# Patient Record
Sex: Male | Born: 1985 | Race: Black or African American | Hispanic: No | Marital: Single | Smoking: Current every day smoker
Health system: Southern US, Community
[De-identification: ages and names within clinical notes are randomized; demographics above are authoritative.]

## PROBLEM LIST (undated history)

## (undated) DIAGNOSIS — Z8659 Personal history of other mental and behavioral disorders: Secondary | ICD-10-CM

## (undated) DIAGNOSIS — Z720 Tobacco use: Secondary | ICD-10-CM

## (undated) HISTORY — DX: Personal history of other mental and behavioral disorders: Z86.59

## (undated) HISTORY — PX: HERNIA REPAIR: SHX51

## (undated) HISTORY — PX: TONSILLECTOMY: SUR1361

## (undated) HISTORY — DX: Tobacco use: Z72.0

## (undated) HISTORY — PX: ADENOIDECTOMY: SUR15

---
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
## 2007-08-17 ENCOUNTER — Emergency Department (HOSPITAL_COMMUNITY): Admission: EM | Admit: 2007-08-17 | Discharge: 2007-08-18 | Payer: Self-pay | Admitting: Emergency Medicine

## 2012-02-09 ENCOUNTER — Emergency Department (HOSPITAL_BASED_OUTPATIENT_CLINIC_OR_DEPARTMENT_OTHER)
Admission: EM | Admit: 2012-02-09 | Discharge: 2012-02-09 | Disposition: A | Discharge status: Home / Self Care | Payer: Self-pay | Attending: Emergency Medicine | Admitting: Emergency Medicine

## 2012-02-09 ENCOUNTER — Encounter (HOSPITAL_BASED_OUTPATIENT_CLINIC_OR_DEPARTMENT_OTHER): Payer: Self-pay | Admitting: Emergency Medicine

## 2012-02-09 ENCOUNTER — Emergency Department (INDEPENDENT_AMBULATORY_CARE_PROVIDER_SITE_OTHER): Payer: Self-pay

## 2012-02-09 DIAGNOSIS — S139XXA Sprain of joints and ligaments of unspecified parts of neck, initial encounter: Secondary | ICD-10-CM | POA: Insufficient documentation

## 2012-02-09 DIAGNOSIS — S335XXA Sprain of ligaments of lumbar spine, initial encounter: Secondary | ICD-10-CM | POA: Insufficient documentation

## 2012-02-09 DIAGNOSIS — M542 Cervicalgia: Secondary | ICD-10-CM | POA: Insufficient documentation

## 2012-02-09 DIAGNOSIS — M549 Dorsalgia, unspecified: Secondary | ICD-10-CM

## 2012-02-09 DIAGNOSIS — Y92410 Unspecified street and highway as the place of occurrence of the external cause: Secondary | ICD-10-CM | POA: Insufficient documentation

## 2012-02-09 DIAGNOSIS — S239XXA Sprain of unspecified parts of thorax, initial encounter: Secondary | ICD-10-CM | POA: Insufficient documentation

## 2012-02-09 DIAGNOSIS — S161XXA Strain of muscle, fascia and tendon at neck level, initial encounter: Secondary | ICD-10-CM

## 2012-02-09 DIAGNOSIS — S39012A Strain of muscle, fascia and tendon of lower back, initial encounter: Secondary | ICD-10-CM

## 2012-02-09 DIAGNOSIS — IMO0002 Reserved for concepts with insufficient information to code with codable children: Secondary | ICD-10-CM

## 2012-02-09 MED ORDER — IBUPROFEN 800 MG PO TABS
800.0000 mg | ORAL_TABLET | Freq: Once | ORAL | Status: AC
  Administered 2012-02-09: 800 mg via ORAL
  Filled 2012-02-09: qty 1

## 2012-02-09 MED ORDER — DIAZEPAM 5 MG PO TABS: 5.0000 mg | ORAL_TABLET | Freq: Three times a day (TID) | ORAL | Status: AC | PRN

## 2012-02-09 MED ORDER — HYDROCODONE-ACETAMINOPHEN 5-325 MG PO TABS: 2.0000 | ORAL_TABLET | ORAL | Status: AC | PRN

## 2012-02-09 MED ORDER — IBUPROFEN 800 MG PO TABS: 800.0000 mg | ORAL_TABLET | Freq: Three times a day (TID) | ORAL | Status: AC | PRN
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
## 2012-10-01 ENCOUNTER — Emergency Department (INDEPENDENT_AMBULATORY_CARE_PROVIDER_SITE_OTHER)
Admission: EM | Admit: 2012-10-01 | Discharge: 2012-10-01 | Disposition: A | Discharge status: Home / Self Care | Payer: Self-pay | Source: Home / Self Care | Attending: Emergency Medicine | Admitting: Emergency Medicine

## 2012-10-01 ENCOUNTER — Encounter (HOSPITAL_COMMUNITY): Payer: Self-pay

## 2012-10-01 DIAGNOSIS — L255 Unspecified contact dermatitis due to plants, except food: Secondary | ICD-10-CM

## 2012-10-01 DIAGNOSIS — R21 Rash and other nonspecific skin eruption: Secondary | ICD-10-CM

## 2012-10-01 MED ORDER — TRIAMCINOLONE ACETONIDE 0.1 % EX CREA: TOPICAL_CREAM | Freq: Two times a day (BID) | CUTANEOUS | Status: DC

## 2012-10-01 MED ORDER — TRIAMCINOLONE ACETONIDE 40 MG/ML IJ SUSP
60.0000 mg | Freq: Once | INTRAMUSCULAR | Status: AC
  Administered 2012-10-01: 60 mg via INTRAMUSCULAR

## 2012-10-01 MED ORDER — TRIAMCINOLONE ACETONIDE 40 MG/ML IJ SUSP
INTRAMUSCULAR | Status: AC
  Filled 2012-10-01: qty 5

## 2012-10-01 NOTE — ED Provider Notes (Signed)
Medical screening examination/treatment/procedure(s) were performed by non-physician practitioner and as supervising physician I was immediately available for consultation/collaboration.  Leslee Home, M.D.   Reuben Likes, MD 10/01/12 [phone]

## 2012-10-01 NOTE — ED Notes (Signed)
C/o rash started 09/23/2012 states that started after doing yard work

## 2012-10-01 NOTE — ED Provider Notes (Signed)
History     CSN: [id number]  Arrival date & time 10/01/12  1708   None     Chief Complaint  Patient presents with  . Rash    (Consider location/radiation/quality/duration/timing/severity/associated sxs/prior treatment) Patient is a 26 y.o. male presenting with rash. The history is provided by the patient.  Rash   This patient complains of a pruritic rash.  Location: bilateral forearms, abdomen and left leg  Onset: 1 wk ago   Course: unchanged Self-treated with: hydrocortisone            Improvement with treatment: no  History Itching: yes  Tenderness: no  New medications/antibiotics: no  Pet exposure: no  Recent travel or tropical exposure: yard work one week ago New soaps, shampoos, detergent, clothing: no Tick/insect exposure: no   Red Flags Feeling ill: no Fever:no Facial/tongue swelling/difficulty breathing:  no  Diabetic or immunocompromised: no   History reviewed. No pertinent past medical history.  History reviewed. No pertinent past surgical history.  No family history on file.  History  Substance Use Topics  . Smoking status: Current Every Day Smoker    Types: Cigarettes  . Smokeless tobacco: Not on file  . Alcohol Use: No      Review of Systems  Constitutional: Negative.   HENT: Negative.   Respiratory: Negative.   Cardiovascular: Negative.   Skin: Positive for rash. Negative for color change, pallor and wound.    Allergies  Review of patient's allergies indicates no known allergies.  Home Medications   Current Outpatient Rx  Name Route Sig Dispense Refill  . TRIAMCINOLONE ACETONIDE 0.1 % EX CREA Topical Apply topically 2 (two) times daily. 30 g 2    BP 124/67  Pulse 71  Temp 98.1 F (36.7 C) (Oral)  Resp 17  SpO2 100%  Physical Exam  Nursing note and vitals reviewed. Constitutional: He is oriented to person, place, and time. Vital signs are normal. He appears well-developed and well-nourished. He is active and  cooperative.  HENT:  Head: Normocephalic.  Eyes: Conjunctivae normal are normal. Pupils are equal, round, and reactive to light. No scleral icterus.  Neck: Trachea normal. Neck supple.  Cardiovascular: Normal rate and regular rhythm.   Pulmonary/Chest: Effort normal and breath sounds normal.  Neurological: He is alert and oriented to person, place, and time. No cranial nerve deficit or sensory deficit.  Skin: Skin is warm and dry. Rash noted. Rash is pustular.     Psychiatric: He has a normal mood and affect. His speech is normal and behavior is normal. Judgment and thought content normal. Cognition and memory are normal.    ED Course  Procedures (including critical care time)  Labs Reviewed - No data to display No results found.   1. Contact dermatitis due to plant   2. Rash and nonspecific skin eruption       MDM  Kenalog 60mg  IM administered in office.  Cool showers; avoid heat, sunlight and anything that makes condition worse. Zyrtec for at least seven days.  Begin triamcinolone-follow instructions.  RTC if symptoms do not improve or begin to have problems swallowing, breathing or significant change in condition.        Johnsie Kindred, NP 10/01/12 1912

## 2012-12-15 ENCOUNTER — Emergency Department (HOSPITAL_COMMUNITY)
Admission: EM | Admit: 2012-12-15 | Discharge: 2012-12-15 | Disposition: A | Discharge status: Home / Self Care | Payer: Self-pay | Attending: Emergency Medicine | Admitting: Emergency Medicine

## 2012-12-15 ENCOUNTER — Encounter (HOSPITAL_COMMUNITY): Payer: Self-pay | Admitting: *Deleted

## 2012-12-15 DIAGNOSIS — L0291 Cutaneous abscess, unspecified: Secondary | ICD-10-CM

## 2012-12-15 DIAGNOSIS — F17200 Nicotine dependence, unspecified, uncomplicated: Secondary | ICD-10-CM | POA: Insufficient documentation

## 2012-12-15 DIAGNOSIS — L0231 Cutaneous abscess of buttock: Secondary | ICD-10-CM | POA: Insufficient documentation

## 2012-12-15 DIAGNOSIS — R21 Rash and other nonspecific skin eruption: Secondary | ICD-10-CM | POA: Insufficient documentation

## 2012-12-15 MED ORDER — ONDANSETRON 4 MG PO TBDP
4.0000 mg | ORAL_TABLET | Freq: Once | ORAL | Status: AC
  Administered 2012-12-15: 4 mg via ORAL
  Filled 2012-12-15: qty 1

## 2012-12-15 MED ORDER — HYDROCODONE-ACETAMINOPHEN 5-325 MG PO TABS: 1.0000 | ORAL_TABLET | ORAL | Status: DC | PRN

## 2012-12-15 MED ORDER — OXYCODONE-ACETAMINOPHEN 5-325 MG PO TABS
2.0000 | ORAL_TABLET | Freq: Once | ORAL | Status: AC
  Administered 2012-12-15: 2 via ORAL
  Filled 2012-12-15: qty 2

## 2012-12-15 NOTE — ED Notes (Signed)
Pt c/o abscess to left buttocks x's 1 week. Reports no drainage at present.

## 2012-12-15 NOTE — ED Provider Notes (Signed)
History     CSN: [id number]  Arrival date & time 12/15/12  1050   First MD Initiated Contact with Patient 12/15/12 1126      Chief Complaint  Patient presents with  . Abscess    (Consider location/radiation/quality/duration/timing/severity/associated sxs/prior treatment) HPI  Pt to the ER for a buttocks abscess. It began 1 week ago and has been progressing since then. He is  having severe pain. No excess thirst, N/V/D. The patient is afebrile and has had abscesses in the past  never requiring surgical intervention. nad vss  History reviewed. No pertinent past medical history.  History reviewed. No pertinent past surgical history.  History reviewed. No pertinent family history.  History  Substance Use Topics  . Smoking status: Current Every Day Smoker    Types: Cigarettes  . Smokeless tobacco: Not on file  . Alcohol Use: No      Review of Systems  Skin: Positive for rash.  All other systems reviewed and are negative.    Allergies  Review of patient's allergies indicates no known allergies.  Home Medications   Current Outpatient Rx  Name  Route  Sig  Dispense  Refill  . PRESCRIPTION MEDICATION   Oral   Take 2 tablets by mouth 2 (two) times daily. Prescription antibiotic         . HYDROCODONE-ACETAMINOPHEN 5-325 MG PO TABS   Oral   Take 1 tablet by mouth every 4 (four) hours as needed for pain.   6 tablet   0     BP 132/77  Pulse 107  Temp 99.5 F (37.5 C) (Oral)  Resp 18  SpO2 100%  Physical Exam  Nursing note and vitals reviewed. Constitutional: He appears well-developed and well-nourished. No distress.  HENT:  Head: Normocephalic and atraumatic.  Eyes: Pupils are equal, round, and reactive to light.  Neck: Normal range of motion. Neck supple.  Cardiovascular: Normal rate and regular rhythm.   Pulmonary/Chest: Effort normal.  Abdominal: Soft.  Genitourinary:     Neurological: He is alert.  Skin: Skin is warm and dry.    ED  Course  INCISION AND DRAINAGE Date/Time: 12/15/2012 3:33 PM Performed by: Dorthula Matas Authorized by: Dorthula Matas Consent: Verbal consent obtained. Consent given by: patient Type: abscess Location: buttocks. Anesthesia: local infiltration Local anesthetic: lidocaine 2% without epinephrine Anesthetic total: 3 ml Patient sedated: no Scalpel size: 11 Needle gauge: 22 Patient tolerance: Patient tolerated the procedure well with no immediate complications.   (including critical care time)  Labs Reviewed - No data to display No results found.   1. Abscess       MDM  No cellulitis. Pt does not need abx. Tolerated procedure well. Feeling much better.  Pt has been advised of the symptoms that warrant their return to the ED. Patient has voiced understanding and has agreed to follow-up with the PCP or specialist.         Dorthula Matas, PA 12/15/12 1535

## 2012-12-15 NOTE — Progress Notes (Signed)
CM spoke with pt during his [facility] visit who confirms self pay [place] resident with no pcp. CM discussed and provided written information for self pay pcps, importance of pcp for f/u care, www.needymeds.org, discounted pharmacies, and other [place] resources such as financial assistance, DSS and  health department Reviewed Health connect number to assist with finding self pay provider close to pt's residence. Reviewed resources for [facility], general medical clinics, [facility] out patient [facility]ies, housing, and other resources in [place]. Pt voiced understanding and appreciation of resources provided 

## 2012-12-17 NOTE — ED Provider Notes (Signed)
Medical screening examination/treatment/procedure(s) were performed by non-physician practitioner and as supervising physician I was immediately available for consultation/collaboration.  Deagen Krass R. Umeka Wrench, MD 12/17/12 1538 

## 2013-02-20 ENCOUNTER — Emergency Department (HOSPITAL_COMMUNITY)
Admission: EM | Admit: 2013-02-20 | Discharge: 2013-02-20 | Disposition: A | Discharge status: Home / Self Care | Payer: No Typology Code available for payment source | Attending: Emergency Medicine | Admitting: Emergency Medicine

## 2013-02-20 ENCOUNTER — Encounter (HOSPITAL_COMMUNITY): Payer: Self-pay | Admitting: Emergency Medicine

## 2013-02-20 ENCOUNTER — Emergency Department (HOSPITAL_COMMUNITY): Payer: Self-pay

## 2013-02-20 DIAGNOSIS — Y92410 Unspecified street and highway as the place of occurrence of the external cause: Secondary | ICD-10-CM | POA: Insufficient documentation

## 2013-02-20 DIAGNOSIS — S139XXA Sprain of joints and ligaments of unspecified parts of neck, initial encounter: Secondary | ICD-10-CM | POA: Insufficient documentation

## 2013-02-20 DIAGNOSIS — S161XXA Strain of muscle, fascia and tendon at neck level, initial encounter: Secondary | ICD-10-CM

## 2013-02-20 DIAGNOSIS — Y9389 Activity, other specified: Secondary | ICD-10-CM | POA: Insufficient documentation

## 2013-02-20 DIAGNOSIS — F17200 Nicotine dependence, unspecified, uncomplicated: Secondary | ICD-10-CM | POA: Insufficient documentation

## 2013-02-20 MED ORDER — NAPROXEN 500 MG PO TABS: 500.0000 mg | ORAL_TABLET | Freq: Two times a day (BID) | ORAL | Status: DC
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
## 2013-02-20 NOTE — Progress Notes (Signed)
During [facility] ED 02/20/13 visit pt was seen by [facility] liaison  Pt offered services to assist with finding a [place] self pay provider, resources & health reform information  

## 2013-02-20 NOTE — ED Notes (Signed)
Patient transported to X-ray 

## 2013-09-10 ENCOUNTER — Encounter (HOSPITAL_COMMUNITY): Payer: Self-pay | Admitting: Emergency Medicine

## 2013-09-10 ENCOUNTER — Emergency Department (HOSPITAL_COMMUNITY)
Admission: EM | Admit: 2013-09-10 | Discharge: 2013-09-10 | Disposition: A | Discharge status: Home / Self Care | Payer: Self-pay | Attending: Emergency Medicine | Admitting: Emergency Medicine

## 2013-09-10 DIAGNOSIS — L539 Erythematous condition, unspecified: Secondary | ICD-10-CM | POA: Insufficient documentation

## 2013-09-10 DIAGNOSIS — L02619 Cutaneous abscess of unspecified foot: Secondary | ICD-10-CM | POA: Insufficient documentation

## 2013-09-10 DIAGNOSIS — B353 Tinea pedis: Secondary | ICD-10-CM | POA: Insufficient documentation

## 2013-09-10 DIAGNOSIS — L03119 Cellulitis of unspecified part of limb: Secondary | ICD-10-CM

## 2013-09-10 DIAGNOSIS — F17200 Nicotine dependence, unspecified, uncomplicated: Secondary | ICD-10-CM | POA: Insufficient documentation

## 2013-09-10 MED ORDER — CEPHALEXIN 500 MG PO CAPS: 500.0000 mg | ORAL_CAPSULE | Freq: Four times a day (QID) | ORAL | Status: DC

## 2013-09-10 MED ORDER — CEPHALEXIN 500 MG PO CAPS: 500.0000 mg | ORAL_CAPSULE | Freq: Four times a day (QID) | ORAL | Status: AC

## 2013-09-10 MED ORDER — TOLNAFTATE 1 % EX POWD: Freq: Two times a day (BID) | CUTANEOUS | Status: DC

## 2013-09-10 MED ORDER — TOLNAFTATE 1 % EX POWD: Freq: Two times a day (BID) | CUTANEOUS | Status: AC

## 2013-09-10 NOTE — ED Provider Notes (Signed)
Medical screening examination/treatment/procedure(s) were performed by non-physician practitioner and as supervising physician I was immediately available for consultation/collaboration.   Arnav M Shaelynn Dragos, MD 09/10/13 2152 

## 2013-09-10 NOTE — ED Notes (Signed)
Per pt, thinks boots he wears for work has been rubbing the top of feet, more irritation on right foot

## 2013-09-10 NOTE — ED Provider Notes (Signed)
CSN: [id number]     Arrival date & time 09/10/13  3086 History   First MD Initiated Contact with Patient 09/10/13 1004     Chief Complaint  Patient presents with  . blisters    (Consider location/radiation/quality/duration/timing/severity/associated sxs/prior Treatment) HPI Comments: Patient presents with one week of bilateral athlete's foot and blisterr to the top of the right foot. Patient states that he wears steel tipped boots at work and sweats a lot. He's been using over-the-counter foot spray as well as Tinactin powder. No fevers, nausea or vomiting. No other treatments prior to arrival. Patient noticed symptoms in the right foot first, and the left foot is beginning to look the same. Onset of symptoms gradual. Course is constant.  The history is provided by the patient.    History reviewed. No pertinent past medical history. History reviewed. No pertinent past surgical history. No family history on file. History  Substance Use Topics  . Smoking status: Current Every Day Smoker    Types: Cigarettes  . Smokeless tobacco: Not on file  . Alcohol Use: No    Review of Systems  Constitutional: Negative for fever.  Gastrointestinal: Negative for nausea and vomiting.  Skin: Positive for color change and wound.  Hematological: Negative for adenopathy.    Allergies  Review of patient's allergies indicates no known allergies.  Home Medications   Current Outpatient Rx  Name  Route  Sig  Dispense  Refill  . naproxen (NAPROSYN) 500 MG tablet   Oral   Take 1 tablet (500 mg total) by mouth 2 (two) times daily.   30 tablet   0    There were no vitals taken for this visit. Physical Exam  Nursing note and vitals reviewed. Constitutional: He appears well-developed and well-nourished.  HENT:  Head: Normocephalic and atraumatic.  Eyes: Conjunctivae are normal.  Neck: Normal range of motion. Neck supple.  Pulmonary/Chest: No respiratory distress.  Neurological: He is alert.   Skin: Skin is warm and dry. There is erythema.  Patient with cellulitis and warmth to the top of his feet bilaterally with scattered small vesicles. On the right foot, the rash extends between the toes. Appears to be secondarily infected tinea pedis.  Psychiatric: He has a normal mood and affect.    ED Course  Procedures (including critical care time) Labs Review Labs Reviewed - No data to display Imaging Review No results found.  10:15 AM Patient seen and examined. Work-up initiated. Medications ordered.   Vital signs reviewed and are as follows: Filed Vitals:   09/10/13 1011  BP: 113/71  Pulse: 62  Temp: 98.6 F (37 C)  Resp: 18   Pt urged to return with worsening pain, worsening swelling, expanding area of redness or streaking up extremity, fever, or any other concerns. Urged to take complete course of antibiotics as prescribed. Pt verbalizes understanding and agrees with plan.   MDM   1. Cellulitis of foot   2. Tinea pedis    Patients with cellulitis associated with bilateral tinea pedis. No systemic symptoms of illness. No comorbidities. Will treat with topical antibiotics and topical antifungals.   Kenyon Eichelberger, PA-C 09/10/13 1018

## 2014-11-02 ENCOUNTER — Emergency Department (HOSPITAL_COMMUNITY)
Admission: EM | Admit: 2014-11-02 | Discharge: 2014-11-02 | Disposition: A | Discharge status: Home / Self Care | Payer: No Typology Code available for payment source | Attending: Emergency Medicine | Admitting: Emergency Medicine

## 2014-11-02 ENCOUNTER — Encounter (HOSPITAL_COMMUNITY): Payer: Self-pay | Admitting: Emergency Medicine

## 2014-11-02 DIAGNOSIS — Z79899 Other long term (current) drug therapy: Secondary | ICD-10-CM | POA: Insufficient documentation

## 2014-11-02 DIAGNOSIS — L0291 Cutaneous abscess, unspecified: Secondary | ICD-10-CM

## 2014-11-02 DIAGNOSIS — L0231 Cutaneous abscess of buttock: Secondary | ICD-10-CM | POA: Insufficient documentation

## 2014-11-02 DIAGNOSIS — Z720 Tobacco use: Secondary | ICD-10-CM | POA: Insufficient documentation

## 2014-11-02 DIAGNOSIS — Z792 Long term (current) use of antibiotics: Secondary | ICD-10-CM | POA: Insufficient documentation

## 2014-11-02 DIAGNOSIS — Z791 Long term (current) use of non-steroidal anti-inflammatories (NSAID): Secondary | ICD-10-CM | POA: Insufficient documentation

## 2014-11-02 MED ORDER — SODIUM BICARBONATE 4 % IV SOLN: 5.0000 mL | Freq: Once | INTRAVENOUS | Status: AC

## 2014-11-02 MED ORDER — SODIUM BICARBONATE 4 % IV SOLN
5.0000 mL | Freq: Once | INTRAVENOUS | Status: DC
  Filled 2014-11-02: qty 5

## 2014-11-02 MED ORDER — LIDOCAINE-EPINEPHRINE 2 %-1:100000 IJ SOLN
20.0000 mL | Freq: Once | INTRAMUSCULAR | Status: AC
  Administered 2014-11-02: 20 mL via INTRADERMAL

## 2014-11-02 NOTE — ED Notes (Signed)
Red, raised, tender area on l/buttiock x 4 days. No drainage noted

## 2014-11-02 NOTE — Discharge Instructions (Signed)
Please return to the ED in e days for a wound check.  Abscess Care After An abscess (also called a boil or furuncle) is an infected area that contains a collection of pus. Signs and symptoms of an abscess include pain, tenderness, redness, or hardness, or you may feel a moveable soft area under your skin. An abscess can occur anywhere in the body. The infection may spread to surrounding tissues causing cellulitis. A cut (incision) by the surgeon was made over your abscess and the pus was drained out. Gauze may have been packed into the space to provide a drain that will allow the cavity to heal from the inside outwards. The boil may be painful for 5 to 7 days. Most people with a boil do not have high fevers. Your abscess, if seen early, may not have localized, and may not have been lanced. If not, another appointment may be required for this if it does not get better on its own or with medications. HOME CARE INSTRUCTIONS   Only take over-the-counter or prescription medicines for pain, discomfort, or fever as directed by your caregiver.  When you bathe, soak and then remove gauze or iodoform packs at least daily or as directed by your caregiver. You may then wash the wound gently with mild soapy water. Repack with gauze or do as your caregiver directs. SEEK IMMEDIATE MEDICAL CARE IF:   You develop increased pain, swelling, redness, drainage, or bleeding in the wound site.  You develop signs of generalized infection including muscle aches, chills, fever, or a general ill feeling.  An oral temperature above 102 F (38.9 C) develops, not controlled by medication. See your caregiver for a recheck if you develop any of the symptoms described above. If medications (antibiotics) were prescribed, take them as directed. Document Released: 06/28/2005 Document Revised: 03/03/2012 Document Reviewed: 02/23/2008 [facility] Patient Information 2015 [place], [place]. This information is not intended to replace  advice given to you by your health care provider. Make sure you discuss any questions you have with your health care provider.

## 2014-11-02 NOTE — ED Provider Notes (Signed)
CSN: [id number]     Arrival date & time 11/02/14  1021 History   First MD Initiated Contact with Patient 11/02/14 1025     Chief Complaint  Patient presents with  . Abscess    red, raised , tender area on l/buttock x 4 days     (Consider location/radiation/quality/duration/timing/severity/associated sxs/prior Treatment) HPI  Leon ConnersJoshua A Lozoya is a(n) 28 y.o. male who presents emergency department with chief complaint of abscess. Patient states that he began having an abscess on the left side of his buttocks. It has been there for about 5 days, worsening, feels heavy and full. He denies pain with defecation however does have pain with wiping. Patient denies fevers, chills, myalgias.  History reviewed. No pertinent past medical history. Past Surgical History  Procedure Laterality Date  . Hernia repair    . Tonsillectomy    . Adenoidectomy     History reviewed. No pertinent family history. History  Substance Use Topics  . Smoking status: Current Every Day Smoker    Types: Cigarettes  . Smokeless tobacco: Not on file  . Alcohol Use: Yes    Review of Systems  Constitutional: Negative for fever and chills.  Gastrointestinal: Negative for vomiting and abdominal pain.  Musculoskeletal: Negative for myalgias, joint swelling and arthralgias.  Skin: Positive for wound.  All other systems reviewed and are negative.     Allergies  Review of patient's allergies indicates no known allergies.  Home Medications   Prior to Admission medications   Medication Sig Start Date End Date Taking? Authorizing Provider  cephALEXin (KEFLEX) 500 MG capsule Take 1 capsule (500 mg total) by mouth 4 (four) times daily. 09/10/13   Renne CriglerJoshua Geiple, PA-C  naproxen (NAPROSYN) 500 MG tablet Take 1 tablet (500 mg total) by mouth 2 (two) times daily. 02/20/13   Linwood DibblesJon Knapp, MD  tolnaftate (TINACTIN) 1 % powder Apply topically 2 (two) times daily. Use for 4 weeks. 09/10/13   Renne CriglerJoshua Geiple, PA-C   BP 99/62 mmHg   Pulse 95  Temp(Src) 98.5 F (36.9 C)  Wt 155 lb (70.308 kg)  SpO2 99% Physical Exam  Constitutional: He appears well-developed and well-nourished. No distress.  HENT:  Head: Normocephalic and atraumatic.  Eyes: Conjunctivae are normal. No scleral icterus.  Neck: Normal range of motion. Neck supple.  Cardiovascular: Normal rate, regular rhythm and normal heart sounds.   Pulmonary/Chest: Effort normal and breath sounds normal. No respiratory distress.  Abdominal: Soft. There is no tenderness.  Musculoskeletal: He exhibits no edema.  Neurological: He is alert.  Skin: Skin is warm and dry. He is not diaphoretic.  Soft, fluctuant abscess about 3 cm lateral to the left side of the rectum. No surrounding induration. Tender to palpation.  Psychiatric: His behavior is normal.  Nursing note and vitals reviewed.   ED Course  Procedures (including critical care time) Labs Review Labs Reviewed - No data to display  Imaging Review No results found.   EKG Interpretation None       INCISION AND DRAINAGE Performed by: Arthor CaptainHarris, Inell Mimbs Consent: Verbal consent obtained. Risks and benefits: risks, benefits and alternatives were discussed Type: abscess  Body area: left buttocks  Anesthesia: local infiltration  Incision was made with a scalpel.  Local anesthetic: lidocaine 2%w/epinephrine  Anesthetic total: 3 ml  Complexity: complex Blunt dissection to break up loculations  Drainage: purulent  Drainage amount: moderate  Packing material: too small for packing.  Patient tolerance: Patient tolerated the procedure well with no immediate complications.  MDM   Final diagnoses:  Abscess   Patient with skin abscess amenable to incision and drainage.  Abscess was not large enough to warrant packing ,  wound recheck in 2 days.  Mild signs of cellulitis is surrounding skin.  Will d/c to home.  No antibiotic therapy is indicated.       Arthor Captainbigail Jelissa Espiritu, PA-C 11/02/14  1122  Raeford RazorStephen Kohut, MD 11/03/14 [phone]

## 2014-11-02 NOTE — Progress Notes (Signed)
[facility] Stacy, ° °Did not get to see patient but will be sending information about [facility] program to help patient establish primary care, using the address provided.  °

## 2014-12-20 ENCOUNTER — Encounter (HOSPITAL_COMMUNITY): Payer: Self-pay

## 2014-12-20 ENCOUNTER — Emergency Department (HOSPITAL_COMMUNITY)
Admission: EM | Admit: 2014-12-20 | Discharge: 2014-12-20 | Disposition: A | Discharge status: Home / Self Care | Payer: No Typology Code available for payment source | Attending: Emergency Medicine | Admitting: Emergency Medicine

## 2014-12-20 ENCOUNTER — Emergency Department (HOSPITAL_COMMUNITY): Payer: No Typology Code available for payment source

## 2014-12-20 DIAGNOSIS — R05 Cough: Secondary | ICD-10-CM | POA: Insufficient documentation

## 2014-12-20 DIAGNOSIS — Z79899 Other long term (current) drug therapy: Secondary | ICD-10-CM | POA: Insufficient documentation

## 2014-12-20 DIAGNOSIS — Z792 Long term (current) use of antibiotics: Secondary | ICD-10-CM | POA: Insufficient documentation

## 2014-12-20 DIAGNOSIS — Z791 Long term (current) use of non-steroidal anti-inflammatories (NSAID): Secondary | ICD-10-CM | POA: Insufficient documentation

## 2014-12-20 DIAGNOSIS — Z720 Tobacco use: Secondary | ICD-10-CM | POA: Insufficient documentation

## 2014-12-20 DIAGNOSIS — R059 Cough, unspecified: Secondary | ICD-10-CM

## 2014-12-20 MED ORDER — BENZONATATE 100 MG PO CAPS: 200.0000 mg | ORAL_CAPSULE | Freq: Two times a day (BID) | ORAL | Status: AC | PRN

## 2014-12-20 NOTE — ED Notes (Signed)
Per pt, cold and cough since Saturday.  No fever. No n/v

## 2014-12-20 NOTE — ED Provider Notes (Signed)
CSN: [id number]     Arrival date & time 12/20/14  1120 History  This chart was scribed for non-physician practitioner, Wynetta EmeryNicole Ashlynd Michna, PA-C, working with Suzi RootsKevin E Steinl, MD, by Ronney LionSuzanne Rangel, ED Scribe. This patient was seen in room WTR8/WTR8 and the patient's care was started at 12:23 PM.   Chief Complaint  Patient presents with  . Cough   The history is provided by the patient. No language interpreter was used.     HPI Comments: Leon ConnersJoshua A Rangel is a 28 y.o. male who presents to the Emergency Department complaining of a severe dry cough that began 2 days ago. He has tried Nyquil that has helped suppress it somewhat. Patient denies fever, chills, chest pain, shortness of breath, nausea, vomiting, rhinorrhea, sore throat, and myalgia.   History reviewed. No pertinent past medical history. Past Surgical History  Procedure Laterality Date  . Hernia repair    . Tonsillectomy    . Adenoidectomy     History reviewed. No pertinent family history. History  Substance Use Topics  . Smoking status: Current Every Day Smoker    Types: Cigarettes  . Smokeless tobacco: Not on file  . Alcohol Use: Yes    Review of Systems  A complete 10 system review of systems was obtained and all systems are negative except as noted in the HPI and PMH.    Allergies  Review of patient's allergies indicates no known allergies.  Home Medications   Prior to Admission medications   Medication Sig Start Date End Date Taking? Authorizing Provider  cephALEXin (KEFLEX) 500 MG capsule Take 1 capsule (500 mg total) by mouth 4 (four) times daily. 09/10/13   Leon CriglerJoshua Geiple, PA-C  naproxen (NAPROSYN) 500 MG tablet Take 1 tablet (500 mg total) by mouth 2 (two) times daily. 02/20/13   Linwood DibblesJon Knapp, MD  tolnaftate (TINACTIN) 1 % powder Apply topically 2 (two) times daily. Use for 4 weeks. 09/10/13   Leon CriglerJoshua Geiple, PA-C   BP 118/74 mmHg  Pulse 90  Temp(Src) 98.1 F (36.7 C) (Oral)  Resp 18  SpO2 99% Physical Exam   Constitutional: He is oriented to person, place, and time. He appears well-developed and well-nourished. No distress.  HENT:  Head: Normocephalic and atraumatic.  Mouth/Throat: Oropharynx is clear and moist.  Eyes: Conjunctivae and EOM are normal. Pupils are equal, round, and reactive to light.  Neck: Normal range of motion. Neck supple. No tracheal deviation present.  Cardiovascular: Normal rate and regular rhythm.  Exam reveals no gallop and no friction rub.   No murmur heard. Pulmonary/Chest: Effort normal and breath sounds normal. No respiratory distress. He has no wheezes. He has no rales. He exhibits no tenderness.  Lungs are clear to auscultation.  Abdominal: Soft. Bowel sounds are normal.  Musculoskeletal: Normal range of motion.  Neurological: He is alert and oriented to person, place, and time.  Skin: Skin is warm and dry.  Psychiatric: He has a normal mood and affect. His behavior is normal.  Nursing note and vitals reviewed.   ED Course  Procedures (including critical care time)  DIAGNOSTIC STUDIES: Oxygen Saturation is 99% on room air, normal by my interpretation.    COORDINATION OF CARE: 12:25 PM - Discussed treatment plan with pt at bedside which includes cough suppressant and pt agreed to plan.   Labs Review Labs Reviewed - No data to display  Imaging Review Dg Chest 2 View  12/20/2014   CLINICAL DATA:  Chest pain with cough. Initial encounter. Cigarette smoker.  EXAM: CHEST  2 VIEW  COMPARISON:  10/20/2013.  FINDINGS: Cardiopericardial silhouette within normal limits. Mediastinal contours normal. Trachea midline. No airspace disease or effusion.  IMPRESSION: No active cardiopulmonary disease.   Electronically Signed   By: Andreas NewportGeoffrey  Lamke M.D.   On: 12/20/2014 12:01     EKG Interpretation None      MDM   Final diagnoses:  Cough     Filed Vitals:   12/20/14 1138 12/20/14 1235  BP: 118/74   Pulse: 90 76  Temp: 98.1 F (36.7 C)   TempSrc: Oral    Resp: 18   SpO2: 99% 100%     Leon ConnersJoshua A Rangel is a pleasant 28 y.o. male presenting with dry cough onset several days ago, lung sounds are clear to auscultation, chest x-rays without infiltrate. Vital signs without abnormality. Advised patient to push fluids, advised him to DC smoking.   Evaluation does not show pathology that would require ongoing emergent intervention or inpatient treatment. Pt is hemodynamically stable and mentating appropriately. Discussed findings and plan with patient/guardian, who agrees with care plan. All questions answered. Return precautions discussed and outpatient follow up given.   Discharge Medication List as of 12/20/2014 12:26 PM    START taking these medications   Details  benzonatate (TESSALON) 100 MG capsule Take 2 capsules (200 mg total) by mouth 2 (two) times daily as needed for cough., Starting 12/20/2014, Until Discontinued, Print         I personally performed the services described in this documentation, which was scribed in my presence. The recorded information has been reviewed and is accurate.    Wynetta Emeryicole Udell Mazzocco, PA-C 12/20/14 1340  Suzi RootsKevin E Steinl, MD 12/21/14 [phone]

## 2014-12-20 NOTE — Discharge Instructions (Signed)
Do not hesitate to return to the emergency room for any new, worsening or concerning symptoms.  Please obtain primary care using resource guide below. But the minute you were seen in the emergency room and that they will need to obtain records for further outpatient management.   Cough, Adult  A cough is a reflex that helps clear your throat and airways. It can help heal the body or may be a reaction to an irritated airway. A cough may only last 2 or 3 weeks (acute) or may last more than 8 weeks (chronic).  CAUSES Acute cough:  Viral or bacterial infections. Chronic cough:  Infections.  Allergies.  Asthma.  Post-nasal drip.  Smoking.  Heartburn or acid reflux.  Some medicines.  Chronic lung problems (COPD).  Cancer. SYMPTOMS   Cough.  Fever.  Chest pain.  Increased breathing rate.  High-pitched whistling sound when breathing (wheezing).  Colored mucus that you cough up (sputum). TREATMENT   A bacterial cough may be treated with antibiotic medicine.  A viral cough must run its course and will not respond to antibiotics.  Your caregiver may recommend other treatments if you have a chronic cough. HOME CARE INSTRUCTIONS   Only take over-the-counter or prescription medicines for pain, discomfort, or fever as directed by your caregiver. Use cough suppressants only as directed by your caregiver.  Use a cold steam vaporizer or humidifier in your bedroom or home to help loosen secretions.  Sleep in a semi-upright position if your cough is worse at night.  Rest as needed.  Stop smoking if you smoke. SEEK IMMEDIATE MEDICAL CARE IF:   You have pus in your sputum.  Your cough starts to worsen.  You cannot control your cough with suppressants and are losing sleep.  You begin coughing up blood.  You have difficulty breathing.  You develop pain which is getting worse or is uncontrolled with medicine.  You have a fever. MAKE SURE YOU:   Understand these  instructions.  Will watch your condition.  Will get help right away if you are not doing well or get worse. Document Released: 06/08/2011 Document Revised: 03/03/2012 Document Reviewed: 06/08/2011 [facility] Patient Information 2015 [place], [place]. This information is not intended to replace advice given to you by your health care provider. Make sure you discuss any questions you have with your health care provider.   Emergency Department Resource Guide 1) Find a Doctor and Pay Out of Pocket Although you won't have to find out who is covered by your insurance plan, it is a good idea to ask around and get recommendations. You will then need to call the office and see if the doctor you have chosen will accept you as a new patient and what types of options they offer for patients who are self-pay. Some doctors offer discounts or will set up payment plans for their patients who do not have insurance, but you will need to ask so you aren't surprised when you get to your appointment.  2) Contact Your Local Health Department Not all health departments have doctors that can see patients for sick visits, but many do, so it is worth a call to see if yours does. If you don't know where your local health department is, you can check in your phone book. The CDC also has a tool to help you locate your state's health department, and many state websites also have listings of all of their local health departments.  3) Find a [facility]usWalk-in Clinic If your  illness is not likely to be very severe or complicated, you may want to try a walk in clinic. These are popping up all over the country in pharmacies, drugstores, and shopping centers. They're usually staffed by nurse practitioners or physician assistants that have been trained to treat common illnesses and complaints. They're usually fairly quick and inexpensive. However, if you have serious medical issues or chronic medical problems, these are probably not your best  option.  No Primary Care Doctor: - Call Health Connect at  [phone] - they can help you locate a primary care doctor that  accepts your insurance, provides certain services, etc. - Physician Referral Service- [phone]  Chronic Pain Problems: Organization         Address  Phone   Notes  [facility]  [phone] Patients need to be referred by their primary care doctor.   Medication Assistance: Organization         Address  Phone   Notes  [facility] Medication [facility] [address] [place], [place] [address] [phone] --Must be a resident of [facility] [place] -- Must have NO insurance coverage whatsoever (no Medicaid/ Medicare, etc.) -- The pt. MUST have a primary care doctor that directs their care regularly and follows them in the community   MedAssist  [phone]   [employer]  [phone]    Agencies that provide inexpensive medical care: Organization         Address  Phone   Notes  [facility]  [phone]   [facility]    [phone]   [facility] [address] [place], [place] [address] [phone]   [facility] [address], [place] [phone]) [phone]   [facility]anned Parenthood    [phone]) [phone]   [facility]    [phone]) [phone]   [facility] and [facility]  [address], [place] Phone:  [phone], Fax:  [phone] Hours of Operation:  9 am - 6 pm, M-F.  Also accepts Medicaid/Medicare and self-pay.  [facility]  [address], [place] Phone: [phone], Fax: [phone]. Hours of Operation:  8:30 am - 5:30 pm, M-F.  Also accepts Medicaid and self-pay.  [facility] [place] [address], [place] Phone: [phone]) [phone]   [facility] [address], [place], [place] [phone], Ext. [phone] Mondays & Thursdays: 7-9 AM.  First 15  patients are seen on a first come, first serve basis.    Medicaid-accepting [facility] Providers:  Organization         Address  Phone   Notes  [facility] [address], [place] [phone] Also accepts self-pay patients.  [facility] [address], [place]  [phone]   [facility] [address], [place] [phone]) [phone]   [facility] [address] [place] [address], [place] [phone]   Renaye RakersVeita Bland [address], [place]   [phone] Only accepts [place] Access [place] patients after they have their name applied to their card.   Self-Pay (no insurance) in [facility] [place]:  Organization         Address  Phone   Notes  Sickle Cell Patients, [facility] [address], [place] [phone]  [facility] [place] [phone]   [facility] [address], [address], [place] [phone]   [facility]/Dr. Osei-Bonsu  [address] [place] or [address], [place] [phone] Phone number for both [place] and [place] locations is the same.  Urgent Medical and [facility] [address], [place] [phone]   [facility] [address] [place] or [address] [phone]   [facility] [address], [place] [phone], phone; [phone], fax Sees patients 1st and 3rd Saturday of every month.  Must not qualify for public or private insurance (i.e. Medicaid, Medicare, [place] Health Choice, Veterans' Benefits)  Household income should be no more than 200% of the poverty level The clinic cannot treat you if you are pregnant or think you are pregnant  Sexually transmitted diseases are not treated at the clinic.    Dental  Care: Organization         Address  Phone  Notes  [facility] of [facility] [phone] Accepts children up to age 63 who are enrolled in [place] or [place]; pregnant women with a Medicaid card; and children who have applied for Medicaid or [place] Health Choice, but were declined, whose parents can pay a reduced fee at time of service.  [facility] Department of [facility]  [address], [place] [phone] Accepts children up to age 5 who are enrolled in [place] or [place]; pregnant women with a Medicaid card; and children who have applied for Medicaid or [place] Health Choice, but were declined, whose parents can pay a reduced fee at time of service.  [facility] Adult [facility] Access PROGRAM  [place] [phone] Patients are seen by appointment only. Walk-ins are not accepted. [place] will see patients 20 years of age and older. Monday - Tuesday (8am-5pm) Most Wednesdays (8:30-5pm) $30 per visit, cash only  [facility] Adult Dental Access PROGRAM  [address], [facility] [phone] Patients are seen by appointment only. Walk-ins are not accepted. [place] will see patients 40 years of age and older. One Wednesday Evening (Monthly: Volunteer Based).  $30 per visit, cash only  [place]  [phone] for adults; Children under age 40, call Graduate Pediatric Dentistry at [phone]. Children aged 52-14, please call [phone] to request a pediatric application.  Dental services are provided in all areas of dental care including fillings, crowns and bridges, complete and partial dentures, implants, gum treatment, root canals, and extractions. Preventive care is also provided. Treatment is provided to both adults and children. Patients are selected via a lottery and there is often a waiting list.   [facility] [address] [place]  [phone] [email or web address]   [facility] [address] [place], [place] [phone], Ext. [phone] Second and Fourth Thursday of each month, opens at 6:30 AM; Clinic ends at 9 AM.  Patients are seen on a first-come first-served basis, and a limited number are seen during each clinic.   [facility]  [address] Hillard Danker [place], [place] [phone]   Eligibility Requirements You must have lived in [place], [place], or [place] counties for at least the  last three months.   You cannot be eligible for state or federal sponsored [employer], including [employer], [place], or [employer].   You generally cannot be eligible for healthcare insurance through your employer.    How to apply: Eligibility screenings are held every Tuesday and Wednesday afternoon from 1:00 pm until 4:00 pm. You do not need an appointment for the interview!  [facility] [address] [place], [place]   [place]  [place] Department  [place]  [phone]    Behavioral Health Resources in the Community: Intensive Outpatient Programs Organization         Address  Phone  Notes  Lula Antigo. [address] [place], [place] [phone]   [facility] [address] [place], [place]   ADS: Alcohol & Drug Svcs [address], [place], [place]   [place] [address],  [place], [place] or [phone]   Substance Abuse Resources Organization         Address  Phone  Notes  Alcohol and Drug Services  [phone]   [place]  [phone]   [facility] [place]   Chinita Pester  [phone]   Residential & Outpatient Substance Abuse Program  [phone]    Psychological Services Organization         Address  Phone  Notes  [facility] [place]  [place]  [phone]   [place] [address], [place] or [phone]    Mobile Crisis Teams Organization         Address  Phone  Notes  [facility]rapeutic [facility]atives, Mobile Crisis Care Unit  [phone]   [facility]  [address] [place], [place]   [place] Levels [address] [place] [phone]    Self-Help/Support Groups Organization         Address  Phone             Notes  [place] of [place] - variety of support groups  [place] Call for more information  Narcotics Anonymous (NA), [facility]aring [facility] [address], [employer] [place]  2 meetings at this location   Special educational needs teacher         Address  Phone  Notes  [facility] Residential Treatment [place],    [place]  [phone]   [facility]  [address] [place] [phone], [place], [place]   [place], [place] [phone] Admissions: 8am-3pm M-F  Incentives Substance [place] [address]    [place], [place] [phone]   [facility] [address] [place], [place], [place]   [facility] [address],  [place], [place]   [facility] Programs - Intensive Outpatient [address], [place], [place]   [facility] ([place].,  [place], [place] [phone] or [phone]   [facility] (RTS) [address], [place], [place] Accepts Medicaid  [facility] [address],  [place] [phone] Substance Abuse/Addiction Treatment   [facility] Organization         Address  Phone  Notes  [facility]  [phone]  Domenic Schwab, PhD [address] Arlis Porta [place], [place]   [phone] or [phone]   [facility] [place], [place] [phone]   [address], [place], [place] [phone] Insurance/Medicaid/sponsorship through [facility] and Families [address], [address] [place]                                    [place], [place] [phone] [place] [address], [place] [phone]    Dr. Adele Schilder  [phone]   [facility] Dept. 1) [address] [place] 2) [place] 3)  [place] [address], [place] [phone]  [phone]   [place] [phone] or [phone] (After Hours)

## 2017-02-26 ENCOUNTER — Encounter (HOSPITAL_COMMUNITY): Payer: Self-pay | Admitting: Emergency Medicine

## 2017-02-26 ENCOUNTER — Emergency Department (HOSPITAL_COMMUNITY): Payer: Self-pay

## 2017-02-26 ENCOUNTER — Emergency Department (HOSPITAL_COMMUNITY)
Admission: EM | Admit: 2017-02-26 | Discharge: 2017-02-26 | Disposition: A | Discharge status: Home / Self Care | Payer: Self-pay | Attending: Emergency Medicine | Admitting: Emergency Medicine

## 2017-02-26 DIAGNOSIS — Y999 Unspecified external cause status: Secondary | ICD-10-CM | POA: Insufficient documentation

## 2017-02-26 DIAGNOSIS — W108XXA Fall (on) (from) other stairs and steps, initial encounter: Secondary | ICD-10-CM | POA: Insufficient documentation

## 2017-02-26 DIAGNOSIS — F17210 Nicotine dependence, cigarettes, uncomplicated: Secondary | ICD-10-CM | POA: Insufficient documentation

## 2017-02-26 DIAGNOSIS — Y929 Unspecified place or not applicable: Secondary | ICD-10-CM | POA: Insufficient documentation

## 2017-02-26 DIAGNOSIS — Y9389 Activity, other specified: Secondary | ICD-10-CM | POA: Insufficient documentation

## 2017-02-26 DIAGNOSIS — M546 Pain in thoracic spine: Secondary | ICD-10-CM | POA: Insufficient documentation

## 2017-02-26 MED ORDER — NAPROXEN 500 MG PO TABS: 500.0000 mg | ORAL_TABLET | Freq: Two times a day (BID) | ORAL | 0 refills | Status: AC

## 2017-02-26 MED ORDER — IBUPROFEN 200 MG PO TABS
600.0000 mg | ORAL_TABLET | Freq: Once | ORAL | Status: AC
  Administered 2017-02-26: 600 mg via ORAL
  Filled 2017-02-26: qty 3

## 2017-02-26 MED ORDER — METHOCARBAMOL 500 MG PO TABS: 500.0000 mg | ORAL_TABLET | Freq: Two times a day (BID) | ORAL | 0 refills | Status: AC

## 2017-02-26 NOTE — Discharge Instructions (Signed)
Take your medications as prescribed. I also recommend applying ice and/or heat to affected area for 15-20 minutes 3-4 times daily. I recommend, squatting or repetitive movements for the next few days until her symptoms have improved. Please follow up with a primary care provider from the Resource Guide provided below in one week as needed. Please return to the Emergency Department if symptoms worsen or new onset of fever, numbness, tingling, groin anesthesia, loss of bowel or bladder, weakness.

## 2017-02-26 NOTE — ED Provider Notes (Signed)
[facility]-EMERGENCY DEPT Provider Note    By signing my name below, I, Earmon PhoenixJennifer Waddell, attest that this documentation has been prepared under the direction and in the presence of Melburn HakeNicole Nadeau, PA-C. Electronically Signed: Earmon PhoenixJennifer Waddell, ED Scribe. 02/26/17. 1:09 PM.    History   Chief Complaint Chief Complaint  Patient presents with  . Back Pain    right    The history is provided by the patient and medical records. No language interpreter was used.    Maryann ConnersJoshua A Delcid is a 31 y.o. male who presents to the Emergency Department complaining of a fall that occurred two days ago. He reports associated mid back pain, lower extremity and bilateral shoulder soreness. Pt states he was carrying a washing machine backwards up concrete stairs when he fell and hit his back on the stairs. He has taken Tylenol and Ibuprofen (last dose last night) for pain with no significant relief. Lying down, bending or turning increases the pain. He denies alleviating factors. He denies head trauma, LOC, numbness, tingling or weakness of the lower extremities, abdominal pain, nausea, vomiting, bowel or bladder incontinence, CP, SOB, fever, chills, bruising or wounds. He denies h/o cancer or IV drug use. He denies any treatment from a chiropractor or acupuncturist recently.   History reviewed. No pertinent past medical history.  There are no active problems to display for this patient.   Past Surgical History:  Procedure Laterality Date  . ADENOIDECTOMY    . HERNIA REPAIR    . TONSILLECTOMY         Home Medications    Prior to Admission medications   Medication Sig Start Date End Date Taking? Authorizing Provider  benzonatate (TESSALON) 100 MG capsule Take 2 capsules (200 mg total) by mouth 2 (two) times daily as needed for cough. 12/20/14   Nicole Pisciotta, PA-C  cephALEXin (KEFLEX) 500 MG capsule Take 1 capsule (500 mg total) by mouth 4 (four) times daily. 09/10/13   Renne CriglerJoshua Geiple, PA-C    methocarbamol (ROBAXIN) 500 MG tablet Take 1 tablet (500 mg total) by mouth 2 (two) times daily. 02/26/17   Barrett HenleNicole Elizabeth Nadeau, PA-C  naproxen (NAPROSYN) 500 MG tablet Take 1 tablet (500 mg total) by mouth 2 (two) times daily. 02/26/17   Barrett HenleNicole Elizabeth Nadeau, PA-C  tolnaftate (TINACTIN) 1 % powder Apply topically 2 (two) times daily. Use for 4 weeks. 09/10/13   Renne CriglerJoshua Geiple, PA-C    Family History No family history on file.  Social History Social History  Substance Use Topics  . Smoking status: Current Every Day Smoker    Types: Cigarettes  . Smokeless tobacco: Not on file  . Alcohol use Yes     Allergies   Patient has no known allergies.   Review of Systems Review of Systems  Constitutional: Negative for chills and fever.  Respiratory: Negative for shortness of breath.   Cardiovascular: Negative for chest pain.  Gastrointestinal: Negative for abdominal pain, nausea and vomiting.       No bowel or bladder incontinence  Musculoskeletal: Positive for back pain.  Skin: Negative for color change and wound.  Neurological: Negative for syncope, weakness and numbness.     Physical Exam Updated Vital Signs BP 125/76 (BP Location: Right Arm)   Pulse 91   Temp 98 F (36.7 C) (Oral)   Resp 16   SpO2 99%   Physical Exam  Constitutional: He is oriented to person, place, and time. He appears well-developed and well-nourished.  HENT:  Head: Normocephalic and  atraumatic.  Eyes: Conjunctivae and EOM are normal. Right eye exhibits no discharge. Left eye exhibits no discharge. No scleral icterus.  Neck: Normal range of motion. Neck supple.  Cardiovascular: Normal rate, regular rhythm, normal heart sounds and intact distal pulses.   Pulmonary/Chest: Effort normal and breath sounds normal.  Abdominal: Soft. Bowel sounds are normal. There is no tenderness.  Musculoskeletal: He exhibits tenderness. He exhibits no edema or deformity.  No midline C or L tenderness. Mild tenderness  over midline lower thoracic spine. Tenderness to palpation over bilateral thoracic paraspinal muscles. Full range of motion of neck and decreased ROM of back due to pain. Full range of motion of bilateral upper and lower extremities, with 5/5 strength. Sensation intact. 2+ radial and PT pulses. Cap refill <2 seconds. Patient able to stand and ambulate without assistance.  Neurological: He is alert and oriented to person, place, and time. He displays normal reflexes.  Skin: Skin is warm and dry.  Nursing note and vitals reviewed.    ED Treatments / Results  DIAGNOSTIC STUDIES: Oxygen Saturation is 99% on RA, normal by my interpretation.   COORDINATION OF CARE: 11:41 AM- Will X-Ray thoracic spine. Will order Motrin prior to imaging. Pt verbalizes understanding and agrees to plan.  Medications  ibuprofen (ADVIL,MOTRIN) tablet 600 mg (600 mg Oral Given 02/26/17 1303)    Labs (all labs ordered are listed, but only abnormal results are displayed) Labs Reviewed - No data to display  EKG  EKG Interpretation None       Radiology Dg Thoracic Spine 2 View  Result Date: 02/26/2017 CLINICAL DATA:  Back pain following fall, initial encounter EXAM: THORACIC SPINE 2 VIEWS COMPARISON:  None. FINDINGS: There is no evidence of thoracic spine fracture. Alignment is normal. No other significant bone abnormalities are identified. IMPRESSION: No acute abnormality noted. Electronically Signed   By: Alcide Clever M.D.   On: 02/26/2017 12:59    Procedures Procedures (including critical care time)  Medications Ordered in ED Medications  ibuprofen (ADVIL,MOTRIN) tablet 600 mg (600 mg Oral Given 02/26/17 1303)     Initial Impression / Assessment and Plan / ED Course  I have reviewed the triage vital signs and the nursing notes.  Pertinent labs & imaging results that were available during my care of the patient were reviewed by me and considered in my medical decision making (see chart for details).       Patient is a 31 y.o. male who presents to the ED with back pain. No neurological deficits appreciated. Patient is ambulatory. No warning symptoms of back pain including: fecal incontinence, urinary retention or overflow incontinence, night sweats, waking from sleep with back pain, unexplained fevers or weight loss, h/o cancer, IVDU, recent trauma. No concern for cauda equina, epidural abscess, or other serious cause of back pain. Imagine negative for any acute findings. Conservative measures such as rest, ice/heat and pain medicine indicated with PCP follow-up if no improvement with conservative management.    I personally performed the services described in this documentation, which was scribed in my presence. The recorded information has been reviewed and is accurate.   Final Clinical Impressions(s) / ED Diagnoses   Final diagnoses:  Acute bilateral thoracic back pain    New Prescriptions New Prescriptions   METHOCARBAMOL (ROBAXIN) 500 MG TABLET    Take 1 tablet (500 mg total) by mouth 2 (two) times daily.   NAPROXEN (NAPROSYN) 500 MG TABLET    Take 1 tablet (500 mg total) by mouth  2 (two) times daily.     Satira [place], [place] 02/26/17 1312    Pricilla Loveless, MD 03/01/17 1031

## 2017-02-26 NOTE — ED Triage Notes (Signed)
Pt reports right lower back post fall yesterday . sts was moving heavy item when fell back landing backward , hit stairs . Ambulated without difficulty to triage room.

## 2017-12-15 IMAGING — DX DG THORACIC SPINE 2V
3 series · 3 of 3 positions shown · non-contrast
Comparison: None.

CLINICAL DATA: Back pain following fall, initial encounter

EXAM:
THORACIC SPINE 2 VIEWS

[t-spine ap]
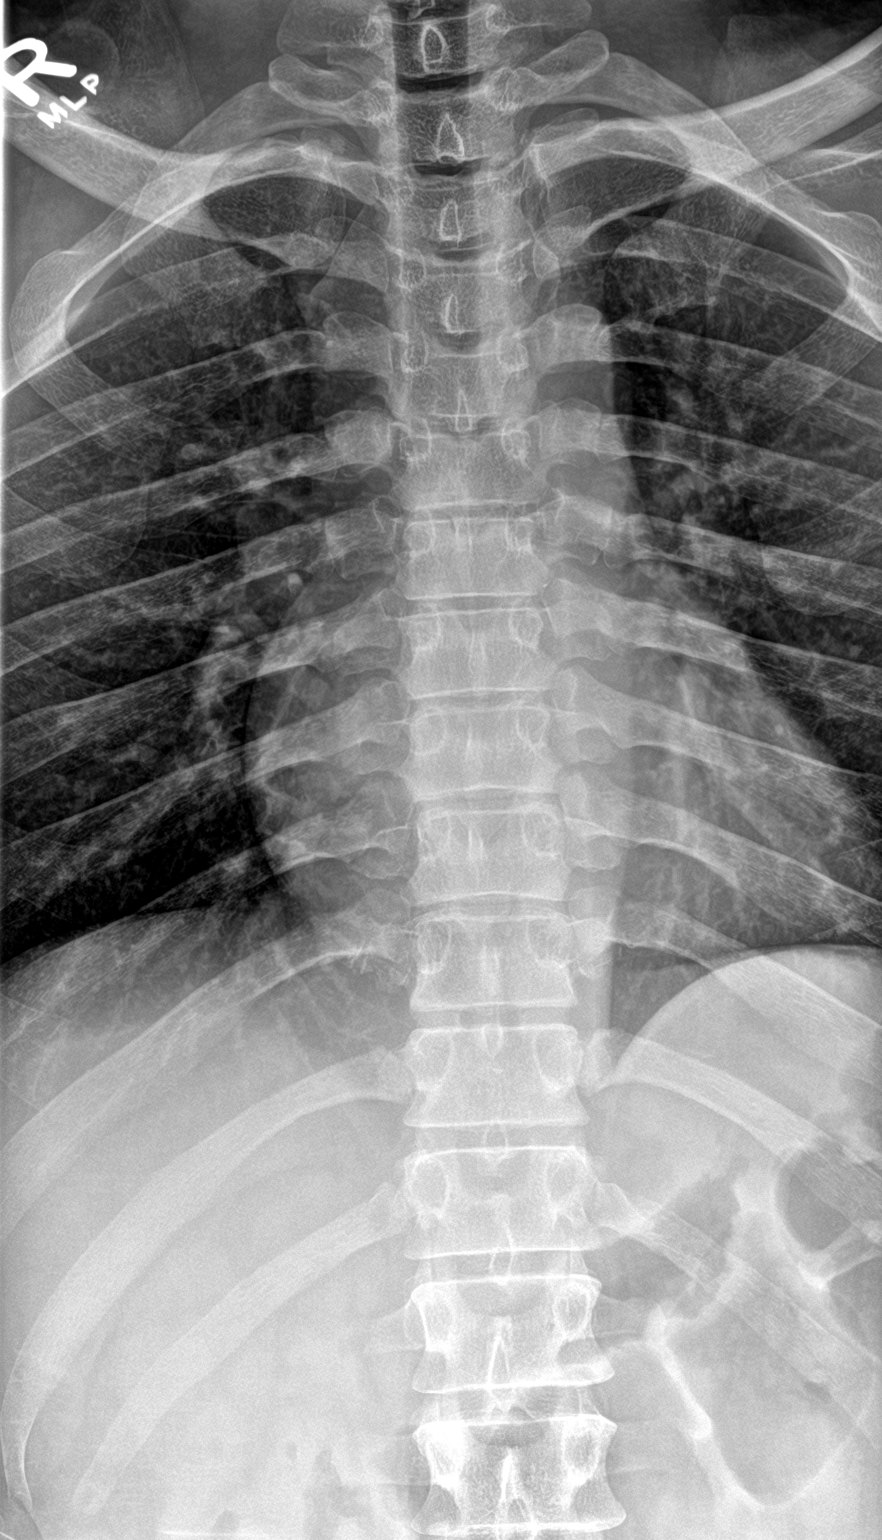

[t-spine lat]
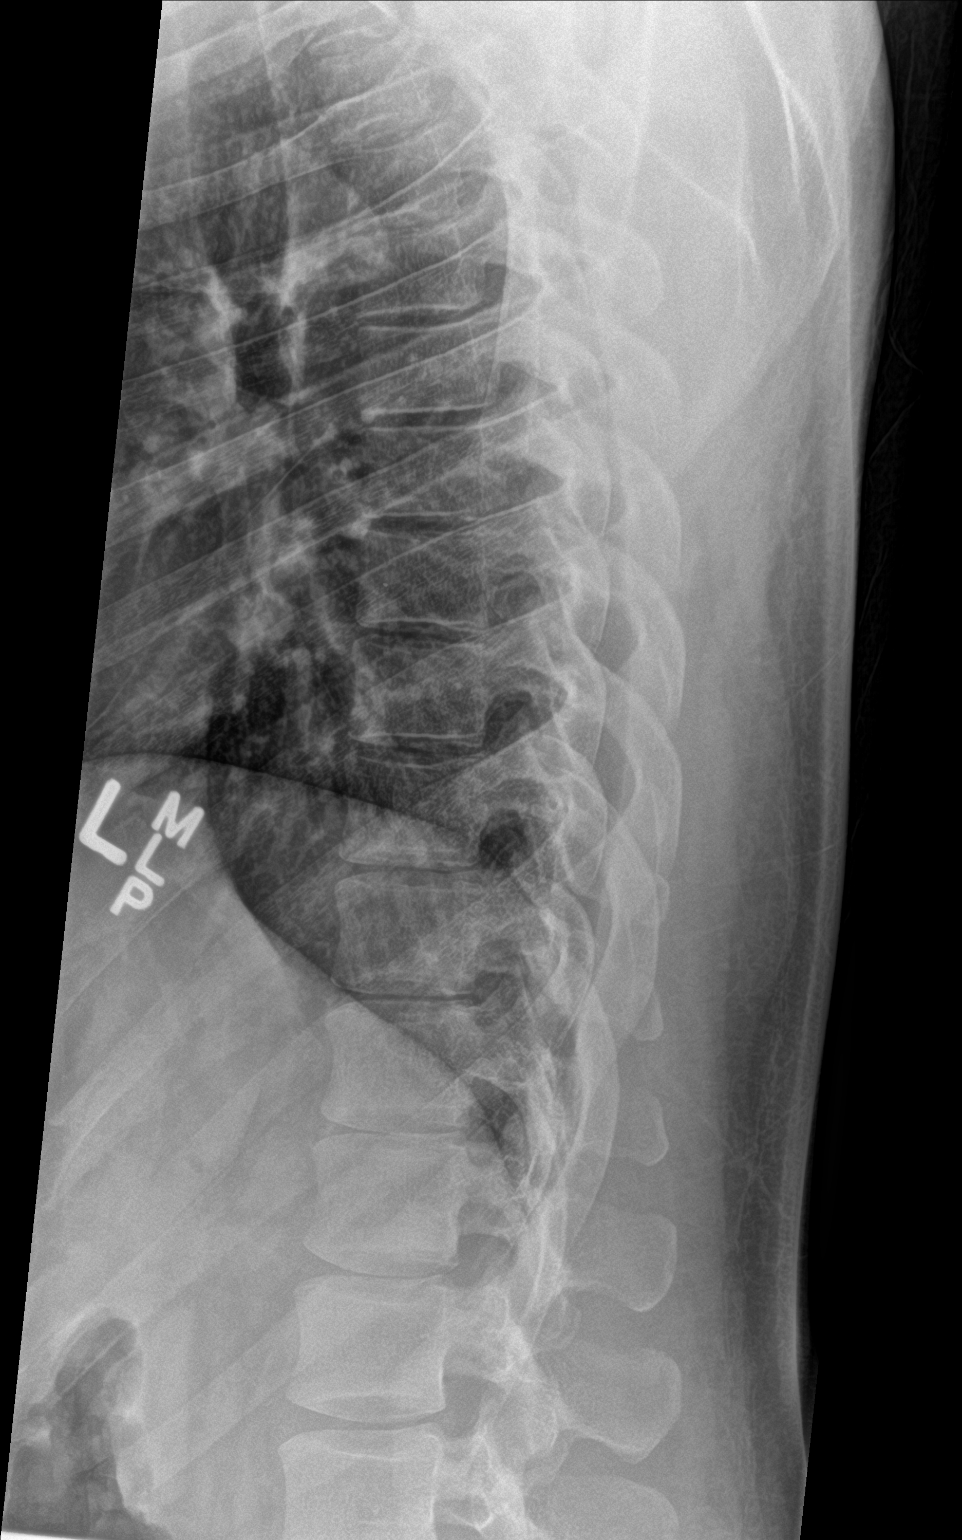

[t-spine swimmers]
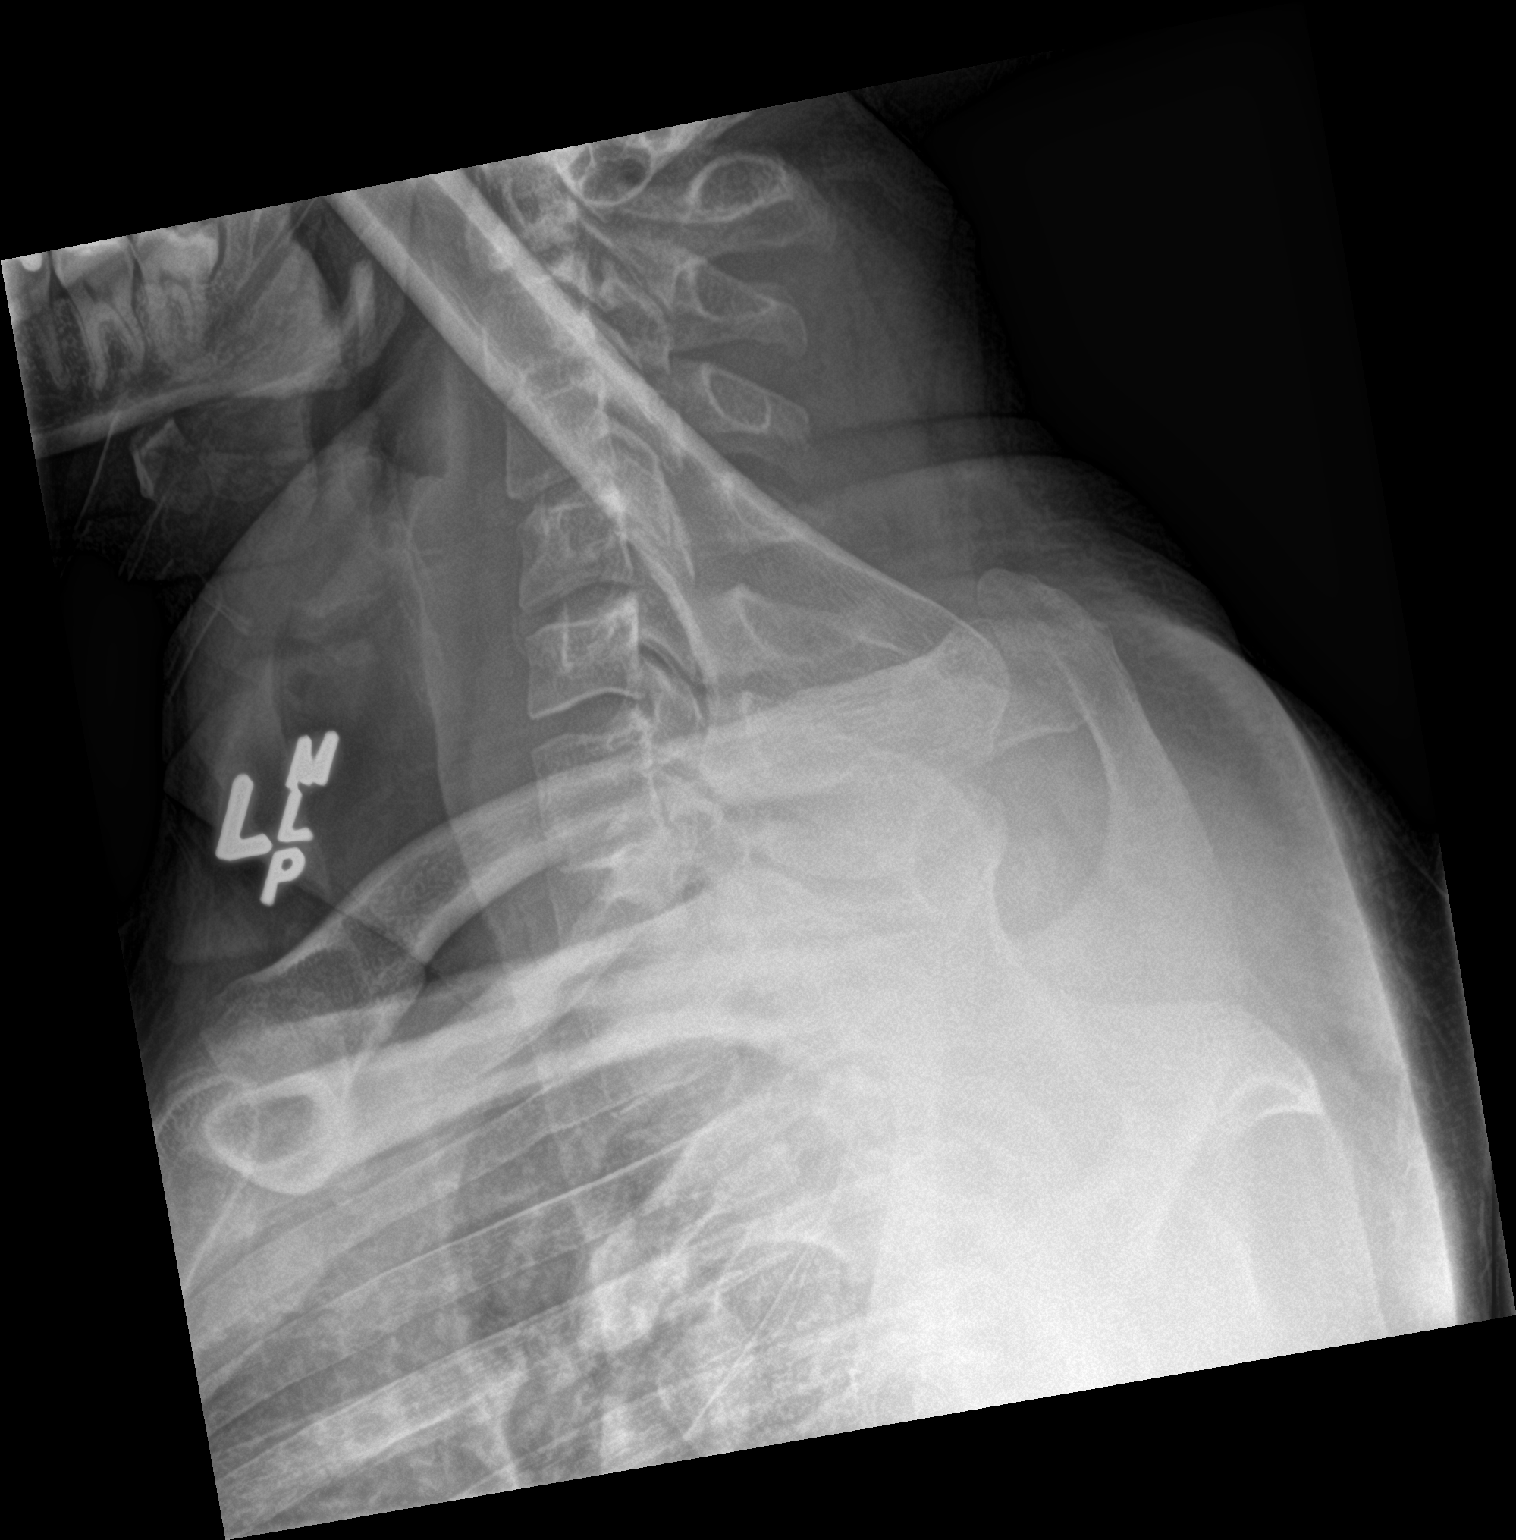

[3 of 3 positions shown; findings below may reference images not displayed]

FINDINGS: There is no evidence of thoracic spine fracture. Alignment is
normal. No other significant bone abnormalities are identified.
IMPRESSION: No acute abnormality noted.

## 2018-03-04 ENCOUNTER — Emergency Department (HOSPITAL_COMMUNITY)
Admission: EM | Admit: 2018-03-04 | Discharge: 2018-03-05 | Disposition: A | Discharge status: Home / Self Care | Payer: Self-pay | Attending: Emergency Medicine | Admitting: Emergency Medicine

## 2018-03-04 ENCOUNTER — Other Ambulatory Visit: Payer: Self-pay

## 2018-03-04 ENCOUNTER — Encounter (HOSPITAL_COMMUNITY): Payer: Self-pay | Admitting: Emergency Medicine

## 2018-03-04 DIAGNOSIS — Y9289 Other specified places as the place of occurrence of the external cause: Secondary | ICD-10-CM | POA: Insufficient documentation

## 2018-03-04 DIAGNOSIS — F17210 Nicotine dependence, cigarettes, uncomplicated: Secondary | ICD-10-CM | POA: Insufficient documentation

## 2018-03-04 DIAGNOSIS — Y999 Unspecified external cause status: Secondary | ICD-10-CM | POA: Insufficient documentation

## 2018-03-04 DIAGNOSIS — W208XXA Other cause of strike by thrown, projected or falling object, initial encounter: Secondary | ICD-10-CM | POA: Insufficient documentation

## 2018-03-04 DIAGNOSIS — Y9389 Activity, other specified: Secondary | ICD-10-CM | POA: Insufficient documentation

## 2018-03-04 DIAGNOSIS — S61011A Laceration without foreign body of right thumb without damage to nail, initial encounter: Secondary | ICD-10-CM | POA: Insufficient documentation

## 2018-03-04 MED ORDER — LIDOCAINE HCL (PF) 1 % IJ SOLN
10.0000 mL | Freq: Once | INTRAMUSCULAR | Status: AC
Start: 2018-03-04 — End: 2018-03-05
  Administered 2018-03-05: 10 mL
  Filled 2018-03-04: qty 30
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
## 2018-03-04 NOTE — Discharge Instructions (Signed)
WOUND CARE °Please have your stitches/staples removed in 7-10 or sooner if you have concerns. You may do this at any available urgent care or at your primary care doctor's office. ° Keep area clean and dry for 24 hours. Do not remove °bandage, if applied. ° After 24 hours, remove bandage and wash wound °gently with mild soap and warm water. Reapply °a new bandage after cleaning wound, if directed. ° Continue daily cleansing with soap and water until °stitches/staples are removed. ° Do not apply any ointments or creams to the wound °while stitches/staples are in place, as this may cause °delayed healing. ° Seek medical careif you experience any of the following °signs of infection: Swelling, redness, pus drainage, °streaking, fever >101.0 F ° Seek care if you experience excessive bleeding °that does not stop after 15-20 minutes of constant, firm °pressure. ° ° ° ° °
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
## 2018-03-13 ENCOUNTER — Emergency Department (HOSPITAL_COMMUNITY)
Admission: EM | Admit: 2018-03-13 | Discharge: 2018-03-13 | Disposition: A | Discharge status: Home / Self Care | Payer: Self-pay | Attending: Emergency Medicine | Admitting: Emergency Medicine

## 2018-03-13 ENCOUNTER — Encounter (HOSPITAL_COMMUNITY): Payer: Self-pay | Admitting: Emergency Medicine

## 2018-03-13 ENCOUNTER — Other Ambulatory Visit: Payer: Self-pay

## 2018-03-13 DIAGNOSIS — Z4802 Encounter for removal of sutures: Secondary | ICD-10-CM | POA: Insufficient documentation

## 2018-03-13 NOTE — ED Notes (Signed)
Bed: WTR6 Expected date:  Expected time:  Means of arrival:  Comments: 

## 2018-03-13 NOTE — Discharge Instructions (Addendum)
Neosporin twice a day. Wash with soap and water. Follow up as needed
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
## 2018-03-13 NOTE — ED Triage Notes (Signed)
Pt here to have stitches in right thumb removed.

## 2019-06-15 ENCOUNTER — Telehealth: Payer: Self-pay | Admitting: Family Medicine

## 2019-06-19 ENCOUNTER — Telehealth (HOSPITAL_BASED_OUTPATIENT_CLINIC_OR_DEPARTMENT_OTHER): Payer: Self-pay | Admitting: Family Medicine

## 2019-06-19 ENCOUNTER — Encounter: Payer: Self-pay | Admitting: Family Medicine

## 2019-06-19 DIAGNOSIS — Z8659 Personal history of other mental and behavioral disorders: Secondary | ICD-10-CM

## 2019-06-19 DIAGNOSIS — F411 Generalized anxiety disorder: Secondary | ICD-10-CM

## 2019-06-19 DIAGNOSIS — F410 Panic disorder [episodic paroxysmal anxiety] without agoraphobia: Secondary | ICD-10-CM

## 2019-06-19 MED ORDER — HYDROXYZINE HCL 10 MG PO TABS: 10.0000 mg | ORAL_TABLET | Freq: Three times a day (TID) | ORAL | 1 refills | Status: AC | PRN

## 2019-06-19 MED ORDER — BUSPIRONE HCL 10 MG PO TABS: 10.0000 mg | ORAL_TABLET | Freq: Two times a day (BID) | ORAL | 2 refills | Status: AC
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
## 2019-06-27 ENCOUNTER — Encounter: Payer: Self-pay | Admitting: Family Medicine

## 2019-06-27 DIAGNOSIS — Z8659 Personal history of other mental and behavioral disorders: Secondary | ICD-10-CM | POA: Insufficient documentation

## 2020-07-03 ENCOUNTER — Encounter (HOSPITAL_COMMUNITY): Payer: Self-pay

## 2020-07-03 ENCOUNTER — Other Ambulatory Visit: Payer: Self-pay

## 2020-07-03 ENCOUNTER — Emergency Department (HOSPITAL_COMMUNITY)
Admission: EM | Admit: 2020-07-03 | Discharge: 2020-07-03 | Disposition: A | Discharge status: Home / Self Care | Payer: Self-pay | Attending: Emergency Medicine | Admitting: Emergency Medicine

## 2020-07-03 DIAGNOSIS — S20361A Insect bite (nonvenomous) of right front wall of thorax, initial encounter: Secondary | ICD-10-CM | POA: Insufficient documentation

## 2020-07-03 DIAGNOSIS — Y939 Activity, unspecified: Secondary | ICD-10-CM | POA: Insufficient documentation

## 2020-07-03 DIAGNOSIS — Y999 Unspecified external cause status: Secondary | ICD-10-CM | POA: Insufficient documentation

## 2020-07-03 DIAGNOSIS — Y929 Unspecified place or not applicable: Secondary | ICD-10-CM | POA: Insufficient documentation

## 2020-07-03 DIAGNOSIS — Z5321 Procedure and treatment not carried out due to patient leaving prior to being seen by health care provider: Secondary | ICD-10-CM | POA: Insufficient documentation

## 2020-07-03 DIAGNOSIS — W57XXXA Bitten or stung by nonvenomous insect and other nonvenomous arthropods, initial encounter: Secondary | ICD-10-CM | POA: Insufficient documentation

## 2020-07-03 NOTE — ED Triage Notes (Signed)
Pt presents with a small alteration in skin integrity on his R chest x 2 days. It looks like an abrasion. Pt reports that he is concerned that it is a spider bite. States that it has been there for 2 days. States that he has tried hydrogen peroxide, alcohol, and neosporin without change. Denies pain, but states that it is pruritic.

## 2020-11-14 ENCOUNTER — Ambulatory Visit: Payer: Self-pay | Admitting: Internal Medicine
